# Patient Record
Sex: Female | Born: 1985 | Race: White | Hispanic: No | Marital: Single
Health system: Southern US, Community
[De-identification: ages and names within clinical notes are randomized; demographics above are authoritative.]

---

## 2020-09-14 ENCOUNTER — Emergency Department (HOSPITAL_COMMUNITY): Payer: BC Managed Care – PPO

## 2020-09-14 ENCOUNTER — Other Ambulatory Visit: Payer: Self-pay

## 2020-09-14 ENCOUNTER — Emergency Department (HOSPITAL_COMMUNITY)
Admission: EM | Admit: 2020-09-14 | Discharge: 2020-09-14 | Disposition: A | Discharge status: Home / Self Care | Payer: BC Managed Care – PPO | Attending: Emergency Medicine | Admitting: Emergency Medicine

## 2020-09-14 ENCOUNTER — Encounter (HOSPITAL_COMMUNITY): Payer: Self-pay | Admitting: Pharmacy Technician

## 2020-09-14 DIAGNOSIS — S90511A Abrasion, right ankle, initial encounter: Secondary | ICD-10-CM | POA: Diagnosis not present

## 2020-09-14 DIAGNOSIS — T148XXA Other injury of unspecified body region, initial encounter: Secondary | ICD-10-CM

## 2020-09-14 DIAGNOSIS — S50311A Abrasion of right elbow, initial encounter: Secondary | ICD-10-CM | POA: Diagnosis not present

## 2020-09-14 DIAGNOSIS — S6991XA Unspecified injury of right wrist, hand and finger(s), initial encounter: Secondary | ICD-10-CM | POA: Diagnosis present

## 2020-09-14 DIAGNOSIS — Y92410 Unspecified street and highway as the place of occurrence of the external cause: Secondary | ICD-10-CM | POA: Diagnosis not present

## 2020-09-14 DIAGNOSIS — S60512A Abrasion of left hand, initial encounter: Secondary | ICD-10-CM | POA: Diagnosis not present

## 2020-09-14 DIAGNOSIS — S60511A Abrasion of right hand, initial encounter: Secondary | ICD-10-CM | POA: Diagnosis not present

## 2020-09-14 LAB — I-STAT BETA HCG BLOOD, ED (MC, WL, AP ONLY): I-stat hCG, quantitative: <5 m[IU]/mL (ref <5)

## 2020-09-14 MED ORDER — CYCLOBENZAPRINE HCL 10 MG PO TABS: 10.0000 mg | ORAL_TABLET | Freq: Two times a day (BID) | ORAL | 0 refills | Status: AC | PRN

## 2020-09-14 MED ORDER — ACETAMINOPHEN 325 MG PO TABS: 650.0000 mg | ORAL_TABLET | Freq: Four times a day (QID) | ORAL | 0 refills | Status: AC | PRN

## 2020-09-14 MED ORDER — IBUPROFEN 600 MG PO TABS: 600.0000 mg | ORAL_TABLET | Freq: Four times a day (QID) | ORAL | 0 refills | Status: AC | PRN

## 2020-09-14 MED ORDER — KETOROLAC TROMETHAMINE 30 MG/ML IJ SOLN
30.0000 mg | Freq: Once | INTRAMUSCULAR | Status: AC
  Administered 2020-09-14: 30 mg via INTRAVENOUS
  Filled 2020-09-14: qty 1

## 2020-09-14 MED ORDER — OXYCODONE HCL 5 MG PO TABS
5.0000 mg | ORAL_TABLET | Freq: Once | ORAL | Status: AC
  Administered 2020-09-14: 5 mg via ORAL
  Filled 2020-09-14: qty 1

## 2020-09-14 MED ORDER — BACITRACIN ZINC 500 UNIT/GM EX OINT: 1.0000 "application " | TOPICAL_OINTMENT | Freq: Two times a day (BID) | CUTANEOUS | 0 refills | Status: AC
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
